# Patient Record
Sex: Male | Born: 1990 | Race: Black or African American | Hispanic: No | Marital: Married | Smoking: Current some day smoker
Health system: Southern US, Community
[De-identification: ages and names within clinical notes are randomized; demographics above are authoritative.]

## PROBLEM LIST (undated history)

## (undated) DIAGNOSIS — J45909 Unspecified asthma, uncomplicated: Secondary | ICD-10-CM

## (undated) DIAGNOSIS — I10 Essential (primary) hypertension: Secondary | ICD-10-CM

## (undated) HISTORY — PX: HAND SURGERY: SHX662

---

## 2017-10-11 ENCOUNTER — Encounter (HOSPITAL_COMMUNITY): Payer: Self-pay | Admitting: *Deleted

## 2017-10-11 ENCOUNTER — Other Ambulatory Visit: Payer: Self-pay

## 2017-10-11 ENCOUNTER — Emergency Department (HOSPITAL_COMMUNITY)
Admission: EM | Admit: 2017-10-11 | Discharge: 2017-10-11 | Disposition: A | Discharge status: Home / Self Care | Payer: Self-pay | Attending: Emergency Medicine | Admitting: Emergency Medicine

## 2017-10-11 DIAGNOSIS — J45909 Unspecified asthma, uncomplicated: Secondary | ICD-10-CM | POA: Insufficient documentation

## 2017-10-11 DIAGNOSIS — K0889 Other specified disorders of teeth and supporting structures: Secondary | ICD-10-CM | POA: Insufficient documentation

## 2017-10-11 DIAGNOSIS — I10 Essential (primary) hypertension: Secondary | ICD-10-CM | POA: Insufficient documentation

## 2017-10-11 DIAGNOSIS — F17200 Nicotine dependence, unspecified, uncomplicated: Secondary | ICD-10-CM | POA: Insufficient documentation

## 2017-10-11 HISTORY — DX: Unspecified asthma, uncomplicated: J45.909

## 2017-10-11 HISTORY — DX: Essential (primary) hypertension: I10

## 2017-10-11 LAB — GROUP A STREP BY PCR: Group A Strep by PCR: NOT DETECTED

## 2017-10-11 MED ORDER — BUPIVACAINE-EPINEPHRINE (PF) 0.5% -1:200000 IJ SOLN
1.8000 mL | Freq: Once | INTRAMUSCULAR | Status: AC
  Administered 2017-10-11: 1.8 mL
  Filled 2017-10-11: qty 1.8

## 2017-10-11 MED ORDER — IBUPROFEN 400 MG PO TABS
600.0000 mg | ORAL_TABLET | Freq: Once | ORAL | Status: AC
  Administered 2017-10-11: 600 mg via ORAL
  Filled 2017-10-11: qty 1

## 2017-10-11 MED ORDER — PENICILLIN V POTASSIUM 500 MG PO TABS: 500.0000 mg | ORAL_TABLET | Freq: Four times a day (QID) | ORAL | 0 refills | Status: AC

## 2017-10-11 MED ORDER — LIDOCAINE VISCOUS HCL 2 % MT SOLN: 15.0000 mL | OROMUCOSAL | 2 refills | Status: DC | PRN

## 2017-10-11 NOTE — Discharge Instructions (Addendum)
Your blood pressure was higher than normal today.  Please follow-up with a primary care provider on this matter.  Dental Pain You have been seen today for dental pain. You should follow up with a dentist as soon as possible. This problem will not resolve on its own without the care of a dentist. Use ibuprofen or naproxen for pain. Use the viscous lidocaine for mouth pain. Swish with the lidocaine and spit it out. Do not swallow it. You should also swish with a homemade salt water solution, twice a day.  Make this solution by mixing 8 ounces of warm water with about half a teaspoon of salt. Antiinflammatory medications: Take 600 mg of ibuprofen every 6 hours or 440 mg (over the counter dose) to 500 mg (prescription dose) of naproxen every 12 hours for the next 3 days. After this time, these medications may be used as needed for pain. Take these medications with food to avoid upset stomach. Choose only one of these medications, do not take them together. Acetaminophen (generic for Tylenol): Should you continue to have additional pain while taking the ibuprofen or naproxen, you may add in acetaminophen as needed. Your daily total maximum amount of acetaminophen from all sources should be limited to 4000mg /day for persons without liver problems, or 2000mg /day for those with liver problems.  Please take all of your antibiotics until finished!   You may develop abdominal discomfort or diarrhea from the antibiotic.  You may help offset this with probiotics which you can buy or get in yogurt. Do not eat or take the probiotics until 2 hours after your antibiotic.   Dental Resource Guide  [employer] [address], [address] [place], [place] [address] [phone]  [facility] [address] [place], [place] [address] [phone]  [facility] [address] [place], [place] [address] [phone]  [facility] [address],  [address] [place], [place] [address] [phone]  [facility] [address] [place], [place] [address] [phone]  [facility] [employer] [email or web address]  [facility] [facility] [address] [place], [place] [address] [phone]  Website for free, low-income, or sliding scale dental services in : www.freedental.us  To find a dentist in [place] and surrounding areas: GuyGalaxy.[email or web address]  [facility] [email or web address]  [facility] Medicaid Dentist [email or web address]

## 2017-10-11 NOTE — ED Provider Notes (Signed)
[facility] EMERGENCY DEPARTMENT Provider Note   CSN: [id number] Arrival date & time: 10/11/17  1214     History   Chief Complaint Chief Complaint  Patient presents with  . Sore Throat  . Dental Pain    HPI Stephen Gregory is a 28 y.o. male.  HPI   Stephen Gregory is a 27 y.o. male, with a history of asthma and HTN, presenting to the ED with left upper dental pain for last two days.  Pain is aching/throbbing, 9/10, radiating posteriorly.  Accompanied by nausea.  Has been taking ibuprofen without complete relief.  Denies fever/chills, vomiting, difficulty swallowing or breathing, or any other complaints.    Past Medical History:  Diagnosis Date  . Asthma   . Hypertension     There are no active problems to display for this patient.   History reviewed. No pertinent surgical history.      Home Medications    Prior to Admission medications   Medication Sig Start Date End Date Taking? Authorizing Provider  lidocaine (XYLOCAINE) 2 % solution Use as directed 15 mLs in the mouth or throat as needed for mouth pain. 10/11/17   Yoshi Vicencio C, PA-C  penicillin v potassium (VEETID) 500 MG tablet Take 1 tablet (500 mg total) by mouth 4 (four) times daily for 7 days. 10/11/17 10/18/17  Anselm Pancoast, PA-C    Family History History reviewed. No pertinent family history.  Social History Social History   Tobacco Use  . Smoking status: Current Some Day Smoker  . Smokeless tobacco: Never Used  Substance Use Topics  . Alcohol use: Not on file  . Drug use: Not on file     Allergies   Codeine   Review of Systems Review of Systems  Constitutional: Negative for chills and fever.  HENT: Positive for dental problem. Negative for trouble swallowing and voice change.   Eyes: Negative for visual disturbance.  Respiratory: Negative for shortness of breath.   Cardiovascular: Negative for chest pain.  Gastrointestinal: Positive for nausea. Negative for vomiting.    Neurological: Negative for dizziness, weakness, light-headedness and numbness.  All other systems reviewed and are negative.    Physical Exam Updated Vital Signs BP (!) 177/98 (BP Location: Right Arm)   Pulse 80   Temp 98 F (36.7 C) (Oral)   Resp 20   SpO2 100%   Physical Exam  Constitutional: He is oriented to person, place, and time. He appears well-developed and well-nourished. No distress.  HENT:  Head: Normocephalic and atraumatic.  Erosion to the medial half of the left maxillary rearmost molar with associated tenderness.  No noted pulp exposure. Dentition appears to be stable.  No noted area of swelling or fluctuance.  No trismus.  Mouth opening to at least 3 finger widths.  Handles oral secretions without difficulty.  No noted facial swelling.  No swelling or tenderness to the submental or submandibular regions.  No swelling or tenderness into the soft tissues of the neck.  Eyes: Conjunctivae are normal.  Neck: Normal range of motion. Neck supple.  Cardiovascular: Normal rate, regular rhythm, normal heart sounds and intact distal pulses.  Pulmonary/Chest: Effort normal and breath sounds normal. No respiratory distress.  Abdominal: He exhibits no distension.  Musculoskeletal: He exhibits no edema.  Lymphadenopathy:    He has no cervical adenopathy.  Neurological: He is alert and oriented to person, place, and time.  Sensation grossly intact to light touch in the extremities. Strength 5/5 in all extremities.  No gait disturbance. Coordination intact. Cranial nerves III-XII grossly intact. No facial droop.   Skin: Skin is warm and dry. He is not diaphoretic. No pallor.  Psychiatric: He has a normal mood and affect. His behavior is normal.  Nursing note and vitals reviewed.    ED Treatments / Results  Labs (all labs ordered are listed, but only abnormal results are displayed) Labs Reviewed  GROUP A STREP BY PCR    EKG None  Radiology No results  found.  Procedures Dental Block Date/Time: 10/11/2017 2:20 PM Performed by: Anselm Pancoast, PA-C Authorized by: Anselm Pancoast, PA-C   Consent:    Consent obtained:  Verbal   Consent given by:  Patient   Risks discussed:  Allergic reaction, infection, swelling, unsuccessful block and pain Indications:    Indications: dental pain   Location:    Block type:  Posterior superior alveolar   Laterality:  Left Procedure details (see MAR for exact dosages):    Syringe type:  Controlled syringe   Needle gauge:  27 G   Anesthetic injected:  Bupivacaine 0.5% WITH epi   Injection procedure:  Anatomic landmarks identified, anatomic landmarks palpated, introduced needle, negative aspiration for blood and incremental injection Post-procedure details:    Outcome:  Anesthesia achieved   Patient tolerance of procedure:  Tolerated well, no immediate complications   (including critical care time)  Medications Ordered in ED Medications  ibuprofen (ADVIL,MOTRIN) tablet 600 mg (has no administration in time range)  bupivacaine-epinephrine (MARCAINE W/ EPI) 0.5% -1:200000 injection 1.8 mL (1.8 mLs Infiltration Given 10/11/17 1421)     Initial Impression / Assessment and Plan / ED Course  I have reviewed the triage vital signs and the nursing notes.  Pertinent labs & imaging results that were available during my care of the patient were reviewed by me and considered in my medical decision making (see chart for details).  Clinical Course as of Oct 11 1525  Tue Oct 11, 2017  1432 Spoke with Dr. Lucky Cowboy, dentist on call.  Agrees with plan for initiating antibiotics and office follow-up.  Requests we fax information on the patient to his office [phone]).  Patient should call the office today to set up an appointment to be seen this week.   [SJ]    Clinical Course User Index [SJ] Joffre Lucks C, PA-C    Patient presents with left upper dental pain.  Low suspicion for sepsis or Ludwig's angioedema.   Dental follow-up. The patient was given instructions for home care as well as return precautions. Patient voices understanding of these instructions, accepts the plan, and is comfortable with discharge.    Patient also noted to be hypertensive.  He endorses an occasional headache, of which he has a history.  No chest pain, shortness of breath, dizziness, syncope, urinary symptoms, or neuro deficits.  Doubt hypertensive emergency.  He is aware of this issue, but I counseled him on the importance of PCP follow-up.  Resources given.    Final Clinical Impressions(s) / ED Diagnoses   Final diagnoses:  Pain, dental    ED Discharge Orders         Ordered    lidocaine (XYLOCAINE) 2 % solution  As needed     10/11/17 1358    penicillin v potassium (VEETID) 500 MG tablet  4 times daily     10/11/17 1358           Anselm Pancoast, PA-C 10/11/17 1548    Messick, Noralyn Pick,  MD 10/13/17 1110

## 2017-10-11 NOTE — ED Triage Notes (Signed)
Pt in c/o left sided mouth pain and swelling, also sore throat and fatigue that started yesterday, taking ibuprofen at home without relief

## 2017-10-11 NOTE — Progress Notes (Signed)
CSW and RNCM spoke with PA to update PA on information needed for pt to be seen by dentist. No further transitions of care needed at this time. CSW signing off.   Claude Manges Chetan Mehring, MSW, LCSW-A Emergency Department Clinical Social Worker [phone]

## 2018-10-30 ENCOUNTER — Encounter (HOSPITAL_COMMUNITY): Payer: Self-pay | Admitting: Family Medicine

## 2018-10-30 ENCOUNTER — Other Ambulatory Visit: Payer: Self-pay

## 2018-10-30 ENCOUNTER — Ambulatory Visit (HOSPITAL_COMMUNITY)
Admission: EM | Admit: 2018-10-30 | Discharge: 2018-10-30 | Disposition: A | Discharge status: Home / Self Care | Payer: Self-pay | Attending: Family Medicine | Admitting: Family Medicine

## 2018-10-30 DIAGNOSIS — S39012A Strain of muscle, fascia and tendon of lower back, initial encounter: Secondary | ICD-10-CM

## 2018-10-30 MED ORDER — DICLOFENAC SODIUM 75 MG PO TBEC: 75.0000 mg | DELAYED_RELEASE_TABLET | Freq: Two times a day (BID) | ORAL | 0 refills | Status: DC

## 2018-10-30 MED ORDER — CYCLOBENZAPRINE HCL 10 MG PO TABS: 10.0000 mg | ORAL_TABLET | Freq: Two times a day (BID) | ORAL | 0 refills | Status: AC | PRN

## 2018-10-30 NOTE — Discharge Instructions (Addendum)
The Flexeril will make you drowsy so just take it at night when she go back to work.

## 2018-10-30 NOTE — ED Provider Notes (Signed)
[facility]    CSN: [id number] Arrival date & time: 10/30/18  0759      History   Chief Complaint Chief Complaint  Patient presents with  . Fall  . Back Pain    HPI Stephen Gregory is a 28 y.o. male.   This is the initial visit for this 28 year old man.  He presents complaining of back pain.  Problem began on Saturday night when he took a misstep and felt a jolt in his left low back.  Pain is continued.  He has had no radiation of the pain.  It remains in his left paralumbar region.  He has not had pain like this before but is able to function.  He has some pain when he lies down flat but it is worse when he stands and twists to the left.  He has no numbness in his legs he is having no difficulty voiding or eliminating.  He has had no fever and no numbness.  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  He likes to workout during wrestling but he missed his workout yesterday trying to recover.     Past Medical History:  Diagnosis Date  . Asthma   . Hypertension     There are no active problems to display for this patient.   Past Surgical History:  Procedure Laterality Date  . HAND SURGERY         Home Medications    Prior to Admission medications   Medication Sig Start Date End Date Taking? Authorizing Provider  cyclobenzaprine (FLEXERIL) 10 MG tablet Take 1 tablet (10 mg total) by mouth 2 (two) times daily as needed for muscle spasms. 10/30/18   Elvina SidleLauenstein, Isaic Syler, MD  diclofenac (VOLTAREN) 75 MG EC tablet Take 1 tablet (75 mg total) by mouth 2 (two) times daily. 10/30/18   Elvina SidleLauenstein, Nautica Hotz, MD    Family History Family History  Family history unknown: Yes    Social History Social History   Tobacco Use  . Smoking status: Current Some Day Smoker  . Smokeless tobacco: Never Used  Substance Use Topics  . Alcohol use: Not on file  . Drug use: Not on file     Allergies   Codeine and Shellfish allergy   Review of Systems  Review of Systems  Musculoskeletal: Positive for back pain.  All other systems reviewed and are negative.    Physical Exam Triage Vital Signs ED Triage Vitals  Enc Vitals Group     BP      Pulse      Resp      Temp      Temp src      SpO2      Weight      Height      Head Circumference      Peak Flow      Pain Score      Pain Loc      Pain Edu?      Excl. in GC?    No data found.  Updated Vital Signs BP (!) 174/81 (BP Location: Right Arm)   Pulse 89   Temp 98.4 F (36.9 C) (Oral)   Resp 18   SpO2 97%   Visual Acuity Right Eye Distance:   Left Eye Distance:   Bilateral Distance:    Right Eye Near:   Left Eye Near:    Bilateral Near:     Physical Exam Vitals signs and nursing note reviewed.  Constitutional:      General: He is not in acute distress.    Appearance: Normal appearance. He is obese.  Eyes:     Conjunctiva/sclera: Conjunctivae normal.  Neck:     Musculoskeletal: Normal range of motion and neck supple.  Cardiovascular:     Rate and Rhythm: Normal rate.  Pulmonary:     Effort: Pulmonary effort is normal.  Musculoskeletal: Normal range of motion.        General: Tenderness and signs of injury present.     Comments: Patient is able to do a sit up without difficulty.  Straight leg raising is negative.  He does have tenderness in the left paralumbar region.  Skin:    General: Skin is warm and dry.  Neurological:     General: No focal deficit present.     Mental Status: He is alert and oriented to person, place, and time.  Psychiatric:        Mood and Affect: Mood normal.        Behavior: Behavior normal.        Thought Content: Thought content normal.      UC Treatments / Results  Labs (all labs ordered are listed, but only abnormal results are displayed) Labs Reviewed - No data to display  EKG   Radiology No results found.  Procedures Procedures (including critical care time)  Medications Ordered in UC Medications - No data  to display  Initial Impression / Assessment and Plan / UC Course  I have reviewed the triage vital signs and the nursing notes.  Pertinent labs & imaging results that were available during my care of the patient were reviewed by me and considered in my medical decision making (see chart for details).    Final Clinical Impressions(s) / UC Diagnoses   Final diagnoses:  Strain of lumbar region, initial encounter     Discharge Instructions     The Flexeril will make you drowsy so just take it at night when she go back to work.    ED Prescriptions    Medication Sig Dispense Auth. Provider   cyclobenzaprine (FLEXERIL) 10 MG tablet Take 1 tablet (10 mg total) by mouth 2 (two) times daily as needed for muscle spasms. 14 tablet Robyn Haber, MD   diclofenac (VOLTAREN) 75 MG EC tablet Take 1 tablet (75 mg total) by mouth 2 (two) times daily. 14 tablet Robyn Haber, MD     I have reviewed the PDMP during this encounter.   Robyn Haber, MD 10/30/18 [phone]

## 2018-10-30 NOTE — ED Triage Notes (Signed)
Pt presents with back pain after a fall on Saturday night.

## 2021-07-26 ENCOUNTER — Emergency Department (HOSPITAL_COMMUNITY)
Admission: EM | Admit: 2021-07-26 | Discharge: 2021-07-26 | Disposition: A | Discharge status: Home / Self Care | Payer: Self-pay | Attending: Emergency Medicine | Admitting: Emergency Medicine

## 2021-07-26 ENCOUNTER — Encounter (HOSPITAL_COMMUNITY): Payer: Self-pay

## 2021-07-26 ENCOUNTER — Emergency Department (HOSPITAL_COMMUNITY): Payer: Self-pay

## 2021-07-26 DIAGNOSIS — Y9301 Activity, walking, marching and hiking: Secondary | ICD-10-CM | POA: Diagnosis not present

## 2021-07-26 DIAGNOSIS — M25562 Pain in left knee: Secondary | ICD-10-CM | POA: Diagnosis present

## 2021-07-26 DIAGNOSIS — S8992XA Unspecified injury of left lower leg, initial encounter: Secondary | ICD-10-CM | POA: Diagnosis not present

## 2021-07-26 DIAGNOSIS — X509XXA Other and unspecified overexertion or strenuous movements or postures, initial encounter: Secondary | ICD-10-CM | POA: Diagnosis not present

## 2021-07-26 MED ORDER — NAPROXEN 500 MG PO TABS: 500.0000 mg | ORAL_TABLET | Freq: Two times a day (BID) | ORAL | 0 refills | Status: AC

## 2021-07-26 MED ORDER — KETOROLAC TROMETHAMINE 30 MG/ML IJ SOLN
30.0000 mg | Freq: Once | INTRAMUSCULAR | Status: AC
  Administered 2021-07-26: 30 mg via INTRAMUSCULAR
  Filled 2021-07-26: qty 1

## 2021-07-26 NOTE — ED Provider Notes (Signed)
[facility]-EMERGENCY DEPT Provider Note   CSN: [id number] Arrival date & time: 07/26/21  0426     History  Chief Complaint  Patient presents with   Knee Pain    Stephen Gregory is a 31 y.o. male.  31 y.o male with a PMH of left knee injury presents to the ED with a chief complaint of left knee pain while at work today.  Patient currently works the night shift for standing when suddenly he was walking and felt a popping sensation to the left knee.  Patient is able to ambulate however describes having a limp.  He has had a prior injury to the left knee however is unsure what this was.  He has not taken any medication for improvement in symptoms.  There was no trauma, no fevers, no other injury reported.  He does not have established orthopedist.  The history is provided by the patient.  Knee Pain Location:  Knee Time since incident:  1 day Associated symptoms: no back pain and no fever        Home Medications Prior to Admission medications   Medication Sig Start Date End Date Taking? Authorizing Provider  naproxen (NAPROSYN) 500 MG tablet Take 1 tablet (500 mg total) by mouth 2 (two) times daily for 7 days. 07/26/21 08/02/21 Yes Hasheem Voland, Leonie Douglas, PA-C  cyclobenzaprine (FLEXERIL) 10 MG tablet Take 1 tablet (10 mg total) by mouth 2 (two) times daily as needed for muscle spasms. 10/30/18   Elvina Sidle, MD      Allergies    Codeine and Shellfish allergy    Review of Systems   Review of Systems  Constitutional:  Negative for fever.  Cardiovascular:  Negative for chest pain.  Musculoskeletal:  Positive for arthralgias. Negative for back pain.    Physical Exam Updated Vital Signs BP (!) 169/89 (BP Location: Left Arm)   Pulse 89   Temp 97.7 F (36.5 C) (Oral)   Resp 20   Ht 6\' 5"  (1.956 m)   SpO2 98%  Physical Exam Vitals and nursing note reviewed.  Constitutional:      Appearance: Normal appearance.  HENT:     Head: Normocephalic and atraumatic.      Mouth/Throat:     Mouth: Mucous membranes are moist.  Cardiovascular:     Rate and Rhythm: Normal rate.  Pulmonary:     Effort: Pulmonary effort is normal.  Abdominal:     General: Abdomen is flat.  Musculoskeletal:     Cervical back: Normal range of motion and neck supple.     Left knee: Swelling present. Tenderness present over the patellar tendon. Normal alignment and normal patellar mobility. Normal pulse.     Comments: Tenderness to palpation along the patellar tendon especially with knee extension.  Pulses are present, patient is intact throughout.  Localized swelling across the entire patella.  Skin:    General: Skin is warm and dry.  Neurological:     Mental Status: He is alert and oriented to person, place, and time.     ED Results / Procedures / Treatments   Labs (all labs ordered are listed, but only abnormal results are displayed) Labs Reviewed - No data to display  EKG None  Radiology DG Knee 2 Views Left  Result Date: 07/26/2021 CLINICAL DATA:  31 year old male with history of left-sided knee pain. EXAM: LEFT KNEE - 1-2 VIEW COMPARISON:  No priors. FINDINGS: No evidence of fracture, dislocation, or joint effusion. No evidence of arthropathy or  other focal bone abnormality. Soft tissues are unremarkable. IMPRESSION: Negative. Electronically Signed   By: Trudie Reed M.D.   On: 07/26/2021 07:49    Procedures Procedures    Medications Ordered in ED Medications  ketorolac (TORADOL) 30 MG/ML injection 30 mg (30 mg Intramuscular Given 07/26/21 0931)    ED Course/ Medical Decision Making/ A&P                           Medical Decision Making Amount and/or Complexity of Data Reviewed Radiology: ordered.  Risk Prescription drug management.   Patient presents to the ED status post left knee injury while walking and feeling a popping sensation.  Significant swelling noted to the area but no palpable effusion.  No erythema noted, no signs of trauma.  There is  no bony tenderness, pain along the suprapatellar area especially with knee extension, has some decreased range of motion due to pain.  X-ray without any acute findings.  Patient for tendon versus ligament injury.  He is ambulatory but with a limp.  I recommended crutches, he is wearing a sleeve that he has at home, also given Toradol while in the ED to help with pain control.  He is agreeable of outpatient follow-up with orthopedics, patient stable for discharge    Portions of this note were generated with Dragon dictation software. Dictation errors may occur despite best attempts at proofreading.   Final Clinical Impression(s) / ED Diagnoses Final diagnoses:  Injury of left knee, initial encounter    Rx / DC Orders ED Discharge Orders          Ordered    naproxen (NAPROSYN) 500 MG tablet  2 times daily        07/26/21 0907              Claude Manges, PA-C 07/26/21 0981    Mancel Bale, MD 07/27/21 1408

## 2021-07-26 NOTE — Discharge Instructions (Addendum)
The x-ray of your knee did not show any acute finding.  I have prescribed a short course of anti-inflammatories in order to help with pain.  Please start this medication tomorrow as you receive an injection of anti-inflammatories while in the ED today.  Follow-up with your orthopedist as needed.  You may use your crutches to get around if this helps with comfort of your knee.

## 2021-07-26 NOTE — ED Triage Notes (Signed)
Patient said he was walking and his left knee popped. Patient able to walk but limping.

## 2022-06-25 ENCOUNTER — Emergency Department (HOSPITAL_COMMUNITY)
Admission: EM | Admit: 2022-06-25 | Discharge: 2022-06-25 | Disposition: A | Discharge status: Home / Self Care | Payer: BC Managed Care – PPO | Attending: Emergency Medicine | Admitting: Emergency Medicine

## 2022-06-25 ENCOUNTER — Emergency Department (HOSPITAL_COMMUNITY): Payer: BC Managed Care – PPO

## 2022-06-25 ENCOUNTER — Other Ambulatory Visit: Payer: Self-pay

## 2022-06-25 ENCOUNTER — Encounter (HOSPITAL_COMMUNITY): Payer: Self-pay | Admitting: Emergency Medicine

## 2022-06-25 DIAGNOSIS — R109 Unspecified abdominal pain: Secondary | ICD-10-CM | POA: Diagnosis not present

## 2022-06-25 DIAGNOSIS — M549 Dorsalgia, unspecified: Secondary | ICD-10-CM | POA: Diagnosis not present

## 2022-06-25 DIAGNOSIS — Y92410 Unspecified street and highway as the place of occurrence of the external cause: Secondary | ICD-10-CM | POA: Insufficient documentation

## 2022-06-25 DIAGNOSIS — M25512 Pain in left shoulder: Secondary | ICD-10-CM | POA: Insufficient documentation

## 2022-06-25 DIAGNOSIS — R079 Chest pain, unspecified: Secondary | ICD-10-CM | POA: Insufficient documentation

## 2022-06-25 DIAGNOSIS — M25561 Pain in right knee: Secondary | ICD-10-CM | POA: Diagnosis not present

## 2022-06-25 DIAGNOSIS — R55 Syncope and collapse: Secondary | ICD-10-CM | POA: Insufficient documentation

## 2022-06-25 LAB — COMPREHENSIVE METABOLIC PANEL
ALT: 32 U/L (ref 0–44)
AST: 24 U/L (ref 15–41)
Albumin: 3.8 g/dL (ref 3.5–5.0)
Alkaline Phosphatase: 63 U/L (ref 38–126)
Anion gap: 9 (ref 5–15)
BUN: 16 mg/dL (ref 6–20)
CO2: 25 mmol/L (ref 22–32)
Calcium: 9 mg/dL (ref 8.9–10.3)
Chloride: 108 mmol/L (ref 98–111)
Creatinine, Ser: 1.31 mg/dL — ABNORMAL HIGH (ref 0.61–1.24)
GFR, Estimated: >60 mL/min (ref >60)
Glucose, Bld: 92 mg/dL (ref 70–99)
Potassium: 3.7 mmol/L (ref 3.5–5.1)
Sodium: 142 mmol/L (ref 135–145)
Total Bilirubin: 0.6 mg/dL (ref 0.3–1.2)
Total Protein: 6.5 g/dL (ref 6.5–8.1)

## 2022-06-25 LAB — I-STAT CHEM 8, ED
BUN: 18 mg/dL (ref 6–20)
Calcium, Ion: 1.18 mmol/L (ref 1.15–1.40)
Chloride: 106 mmol/L (ref 98–111)
Creatinine, Ser: 1.3 mg/dL — ABNORMAL HIGH (ref 0.61–1.24)
Glucose, Bld: 88 mg/dL (ref 70–99)
HCT: 43 % (ref 39.0–52.0)
Hemoglobin: 14.6 g/dL (ref 13.0–17.0)
Potassium: 3.6 mmol/L (ref 3.5–5.1)
Sodium: 144 mmol/L (ref 135–145)
TCO2: 27 mmol/L (ref 22–32)

## 2022-06-25 LAB — CBC
HCT: 42.2 % (ref 39.0–52.0)
Hemoglobin: 14.3 g/dL (ref 13.0–17.0)
MCH: 29.7 pg (ref 26.0–34.0)
MCHC: 33.9 g/dL (ref 30.0–36.0)
MCV: 87.7 fL (ref 80.0–100.0)
Platelets: 252 10*3/uL (ref 150–400)
RBC: 4.81 MIL/uL (ref 4.22–5.81)
RDW: 13 % (ref 11.5–15.5)
WBC: 7 10*3/uL (ref 4.0–10.5)
nRBC: 0 % (ref 0.0–0.2)

## 2022-06-25 MED ORDER — ACETAMINOPHEN 500 MG PO TABS: 1000.0000 mg | ORAL_TABLET | Freq: Four times a day (QID) | ORAL | Status: DC | PRN

## 2022-06-25 MED ORDER — IOHEXOL 350 MG/ML SOLN
75.0000 mL | Freq: Once | INTRAVENOUS | Status: AC | PRN
  Administered 2022-06-25: 75 mL via INTRAVENOUS

## 2022-06-25 MED ORDER — ACETAMINOPHEN 500 MG PO TABS
1000.0000 mg | ORAL_TABLET | Freq: Once | ORAL | Status: AC
  Administered 2022-06-25: 1000 mg via ORAL
  Filled 2022-06-25: qty 2

## 2022-06-25 MED ORDER — FENTANYL CITRATE PF 50 MCG/ML IJ SOSY
50.0000 ug | PREFILLED_SYRINGE | Freq: Once | INTRAMUSCULAR | Status: AC
  Administered 2022-06-25: 50 ug via INTRAVENOUS
  Filled 2022-06-25: qty 1

## 2022-06-25 MED ORDER — METHOCARBAMOL 500 MG PO TABS: 500.0000 mg | ORAL_TABLET | Freq: Two times a day (BID) | ORAL | 0 refills | Status: DC

## 2022-06-25 MED ORDER — METHOCARBAMOL 500 MG PO TABS: 500.0000 mg | ORAL_TABLET | Freq: Two times a day (BID) | ORAL | 0 refills | Status: AC
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
## 2022-06-25 NOTE — Discharge Instructions (Addendum)
Discussed results with patient. Follow up with PCP within 2 for chronic disease changes in lower back seen on CT. No acute findings on imaging.  Return to ED if you develop vision changes, persistent headache, or feel like you're going to pass out.
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
## 2022-07-22 ENCOUNTER — Emergency Department (HOSPITAL_COMMUNITY): Payer: BC Managed Care – PPO

## 2022-07-22 ENCOUNTER — Encounter (HOSPITAL_COMMUNITY): Payer: Self-pay

## 2022-07-22 ENCOUNTER — Emergency Department (HOSPITAL_COMMUNITY)
Admission: EM | Admit: 2022-07-22 | Discharge: 2022-07-22 | Disposition: A | Discharge status: Home / Self Care | Payer: BC Managed Care – PPO | Attending: Emergency Medicine | Admitting: Emergency Medicine

## 2022-07-22 ENCOUNTER — Other Ambulatory Visit: Payer: Self-pay

## 2022-07-22 DIAGNOSIS — X58XXXA Exposure to other specified factors, initial encounter: Secondary | ICD-10-CM | POA: Insufficient documentation

## 2022-07-22 DIAGNOSIS — M5136 Other intervertebral disc degeneration, lumbar region: Secondary | ICD-10-CM | POA: Diagnosis not present

## 2022-07-22 DIAGNOSIS — S39012A Strain of muscle, fascia and tendon of lower back, initial encounter: Secondary | ICD-10-CM | POA: Diagnosis not present

## 2022-07-22 DIAGNOSIS — S29019A Strain of muscle and tendon of unspecified wall of thorax, initial encounter: Secondary | ICD-10-CM | POA: Insufficient documentation

## 2022-07-22 DIAGNOSIS — M479 Spondylosis, unspecified: Secondary | ICD-10-CM

## 2022-07-22 DIAGNOSIS — M25562 Pain in left knee: Secondary | ICD-10-CM | POA: Insufficient documentation

## 2022-07-22 DIAGNOSIS — M25561 Pain in right knee: Secondary | ICD-10-CM | POA: Insufficient documentation

## 2022-07-22 DIAGNOSIS — M545 Low back pain, unspecified: Secondary | ICD-10-CM | POA: Diagnosis present

## 2022-07-22 DIAGNOSIS — S39012D Strain of muscle, fascia and tendon of lower back, subsequent encounter: Secondary | ICD-10-CM

## 2022-07-22 MED ORDER — ACETAMINOPHEN 500 MG PO TABS
1000.0000 mg | ORAL_TABLET | Freq: Once | ORAL | Status: AC
  Administered 2022-07-22: 1000 mg via ORAL
  Filled 2022-07-22: qty 2

## 2022-07-22 MED ORDER — TRAMADOL HCL 50 MG PO TABS: 50.0000 mg | ORAL_TABLET | Freq: Four times a day (QID) | ORAL | 0 refills | Status: AC | PRN

## 2022-07-22 MED ORDER — METHYLPREDNISOLONE 4 MG PO TBPK: ORAL_TABLET | ORAL | 0 refills | Status: DC

## 2022-07-22 NOTE — Discharge Instructions (Addendum)
1.  Take the Medrol Dosepak as prescribed.  You may take extra strength Tylenol every 6 hours with 1-2 tramadol tablets for additional pain control. 2.  Make an appointment to follow-up with [place] [facility].  Contact information is in your discharge instructions.
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
## 2022-12-16 ENCOUNTER — Encounter (HOSPITAL_COMMUNITY): Payer: Self-pay

## 2022-12-16 ENCOUNTER — Emergency Department (HOSPITAL_COMMUNITY): Payer: Self-pay

## 2022-12-16 ENCOUNTER — Emergency Department (HOSPITAL_COMMUNITY)
Admission: EM | Admit: 2022-12-16 | Discharge: 2022-12-17 | Disposition: A | Discharge status: Home / Self Care | Payer: Self-pay | Attending: Emergency Medicine | Admitting: Emergency Medicine

## 2022-12-16 ENCOUNTER — Other Ambulatory Visit: Payer: Self-pay

## 2022-12-16 DIAGNOSIS — J4521 Mild intermittent asthma with (acute) exacerbation: Secondary | ICD-10-CM | POA: Insufficient documentation

## 2022-12-16 DIAGNOSIS — Z79899 Other long term (current) drug therapy: Secondary | ICD-10-CM | POA: Insufficient documentation

## 2022-12-16 DIAGNOSIS — Z1152 Encounter for screening for COVID-19: Secondary | ICD-10-CM | POA: Insufficient documentation

## 2022-12-16 DIAGNOSIS — I10 Essential (primary) hypertension: Secondary | ICD-10-CM | POA: Insufficient documentation

## 2022-12-16 MED ORDER — ALBUTEROL SULFATE HFA 108 (90 BASE) MCG/ACT IN AERS
2.0000 | INHALATION_SPRAY | RESPIRATORY_TRACT | Status: DC | PRN
  Administered 2022-12-16: 2 via RESPIRATORY_TRACT
  Filled 2022-12-16: qty 6.7

## 2022-12-16 NOTE — ED Triage Notes (Signed)
Patient has a history or asthma. Yesterday he started having a cough, sore throat from the cough and chest tightness. Patient has not taken any medication for it because he has not had an issues with his asthma in years.

## 2022-12-17 LAB — SARS CORONAVIRUS 2 BY RT PCR: SARS Coronavirus 2 by RT PCR: NEGATIVE

## 2022-12-17 MED ORDER — PREDNISONE 20 MG PO TABS
60.0000 mg | ORAL_TABLET | Freq: Once | ORAL | Status: AC
  Administered 2022-12-17: 60 mg via ORAL
  Filled 2022-12-17: qty 3

## 2022-12-17 MED ORDER — IPRATROPIUM-ALBUTEROL 0.5-2.5 (3) MG/3ML IN SOLN
3.0000 mL | Freq: Once | RESPIRATORY_TRACT | Status: AC
  Administered 2022-12-17: 3 mL via RESPIRATORY_TRACT
  Filled 2022-12-17: qty 3

## 2022-12-17 MED ORDER — PREDNISONE 10 MG (21) PO TBPK: ORAL_TABLET | Freq: Every day | ORAL | 0 refills | Status: AC

## 2022-12-17 NOTE — Discharge Instructions (Signed)
You were seen here today for your chest tightness and cough.  You are experiencing an asthma exacerbation.  Please take the prescribed steroid for the next week.  Use your inhaler as necessary and follow-up with your primary care doctor.  Additionally please follow-up with your PCP for further evaluation of your blood pressure.  Return to the ER with any severe symptoms

## 2022-12-17 NOTE — ED Provider Notes (Signed)
[place] EMERGENCY DEPARTMENT AT [facility] Provider Note   CSN: [id number] Arrival date & time: 12/16/22  2257     History  Chief Complaint  Patient presents with   Cough    Stephen Gregory is a 32 y.o. male who presents with concern for chest tightness and cough for a few days, hx of asthma in the past, but does not have any medications at this time. Hx of HTN, but managing with life style changes and following with PCP with [facility].   Denies chest pain, palpitations, or other infectious symptoms.   HPI     Home Medications Prior to Admission medications   Medication Sig Start Date End Date Taking? Authorizing Provider  predniSONE (STERAPRED UNI-PAK 21 TAB) 10 MG (21) TBPK tablet Take by mouth daily. Take 6 tabs by mouth daily  for 1 days, then 5 tabs for 1 days, then 4 tabs for 1 days, then 3 tabs for 1 days, 2 tabs for 1 days, then 1 tab by mouth daily for 2 days 12/17/22  Yes Antoinette Haskett R, PA-C  cyclobenzaprine (FLEXERIL) 10 MG tablet Take 1 tablet (10 mg total) by mouth 2 (two) times daily as needed for muscle spasms. 10/30/18   Elvina Sidle, MD  methocarbamol (ROBAXIN) 500 MG tablet Take 1 tablet (500 mg total) by mouth 2 (two) times daily. 06/25/22   Maxwell Marion, PA-C  traMADol (ULTRAM) 50 MG tablet Take 1 tablet (50 mg total) by mouth every 6 (six) hours as needed. 1-2 tablets every 6 hours as needed with extra strength Tylenol 07/22/22   Arby Barrette, MD      Allergies    Codeine and Shellfish allergy    Review of Systems   Review of Systems  Constitutional: Negative.   HENT: Negative.    Respiratory:  Positive for cough, chest tightness and wheezing. Negative for choking, shortness of breath and stridor.   Cardiovascular: Negative.   Gastrointestinal: Negative.     Physical Exam Updated Vital Signs BP (!) 164/98   Pulse 91   Temp 98.2 F (36.8 C) (Oral)   Resp 20   Ht 6\' 5"  (1.956 m)   Wt 117.9 kg   SpO2 96%   BMI 30.83 kg/m   Physical Exam Vitals and nursing note reviewed.  Constitutional:      Appearance: He is not ill-appearing or toxic-appearing.  HENT:     Head: Normocephalic and atraumatic.     Mouth/Throat:     Mouth: Mucous membranes are moist.     Pharynx: Oropharynx is clear. Uvula midline. No oropharyngeal exudate or posterior oropharyngeal erythema.     Tonsils: No tonsillar exudate.  Eyes:     General:        Right eye: No discharge.        Left eye: No discharge.     Conjunctiva/sclera: Conjunctivae normal.  Cardiovascular:     Rate and Rhythm: Normal rate and regular rhythm.     Pulses: Normal pulses.     Heart sounds: Normal heart sounds. No murmur heard. Pulmonary:     Effort: Pulmonary effort is normal. Prolonged expiration present. No tachypnea, accessory muscle usage or respiratory distress.     Breath sounds: Decreased air movement present. Examination of the right-upper field reveals wheezing. Examination of the left-upper field reveals wheezing. Examination of the right-middle field reveals wheezing. Examination of the left-middle field reveals wheezing. Examination of the right-lower field reveals wheezing. Examination of the left-lower field reveals wheezing. Wheezing  present. No rales.  Chest:     Chest wall: No mass, lacerations, deformity, swelling, tenderness, crepitus or edema.  Abdominal:     General: Bowel sounds are normal. There is no distension.     Palpations: Abdomen is soft.     Tenderness: There is no abdominal tenderness.  Musculoskeletal:        General: No deformity.     Cervical back: Neck supple.     Right lower leg: No edema.     Left lower leg: No edema.  Skin:    General: Skin is warm and dry.  Neurological:     Mental Status: He is alert. Mental status is at baseline.  Psychiatric:        Mood and Affect: Mood normal.     ED Results / Procedures / Treatments   Labs (all labs ordered are listed, but only abnormal results are displayed) Labs  Reviewed  SARS CORONAVIRUS 2 BY RT PCR    EKG EKG Interpretation Date/Time:  Friday December 17 2022 00:15:33 EST Ventricular Rate:  94 PR Interval:  130 QRS Duration:  79 QT Interval:  329 QTC Calculation: 412 R Axis:   67  Text Interpretation: Sinus rhythm Anteroseptal infarct, old No significant change was found Confirmed by Glynn Octave [phone]) on 12/17/2022 2:56:02 AM  Radiology DG Chest 2 View  Result Date: 12/17/2022 CLINICAL DATA:  Chest tightness, cough EXAM: CHEST - 2 VIEW COMPARISON:  None Available. FINDINGS: Mild peribronchial thickening. Heart and mediastinal contours are within normal limits. No focal opacities or effusions. No acute bony abnormality. IMPRESSION: Mild bronchitic changes. Electronically Signed   By: Charlett Nose M.D.   On: 12/17/2022 00:16    Procedures Procedures    Medications Ordered in ED Medications  albuterol (VENTOLIN HFA) 108 (90 Base) MCG/ACT inhaler 2 puff (2 puffs Inhalation Given 12/16/22 2313)  ipratropium-albuterol (DUONEB) 0.5-2.5 (3) MG/3ML nebulizer solution 3 mL (3 mLs Nebulization Given 12/17/22 0310)  ipratropium-albuterol (DUONEB) 0.5-2.5 (3) MG/3ML nebulizer solution 3 mL (3 mLs Nebulization Given 12/17/22 0400)  predniSONE (DELTASONE) tablet 60 mg (60 mg Oral Given 12/17/22 0358)    ED Course/ Medical Decision Making/ A&P                                 Medical Decision Making 32 year old male presents with wheezing and chest tightness.  Hypertensive on intake medicines was reassuring.  Cardiac exam unremarkable, pulmonary exam with wheezing throughout the lung fields bilaterally decreased air movement in the bases.  No lower extremity edema.  DDx includes but is not limited to ACS, PE, pleural effusion, pneumonia, pneumothorax, asthma exacerbation.  Amount and/or Complexity of Data Reviewed Labs:     Details: COVID test negative, Radiology: ordered.    Details:  chest x-ray with bronchitic changes,  ECG/medicine  tests:     Details: EKG with sinus rhythm without STEMI.    Risk Prescription drug management.   Patient reevaluated after 2 DuoNebs with significant improvement in his pulmonary exam.  Also offered first dose of prednisone in the ED.  Will discharge with prescription for prednisone taper for acute asthma exacerbation recommend close outpatient follow-up with PCP.  Also discussed patient's hypertension in the emergency department, patient preference to forego antihypertensive initiation at this time and to continue following up with PCP given recent lifestyle changes and weight loss for management of BP.  Clinical concern for emergent underlying etiology as patient  symptoms that warrant further ED workup or inpatient management is exceedingly low.  Claro  voiced understanding of his medical evaluation and treatment plan. Each of their questions answered to their expressed satisfaction.  Return precautions were given.  Patient is well-appearing, stable, and was discharged in good condition.  This chart was dictated using voice recognition software, Dragon. Despite the best efforts of this provider to proofread and correct errors, errors may still occur which can change documentation meaning.         Final Clinical Impression(s) / ED Diagnoses Final diagnoses:  Mild intermittent asthma with exacerbation    Rx / DC Orders ED Discharge Orders          Ordered    predniSONE (STERAPRED UNI-PAK 21 TAB) 10 MG (21) TBPK tablet  Daily        12/17/22 0407              Baeleigh Devincent, Eugene Gavia, PA-C 12/17/22 0429    Glynn Octave, MD 12/17/22 [phone]

## 2022-12-17 NOTE — ED Notes (Addendum)
Pt reported to lobby NT that his CP had worsened. EKG repeated. VS repeated and stable.

## 2022-12-17 NOTE — ED Notes (Signed)
Pt c/o increasing chest pain, states pain is "6/10" in "center of chest". Rechecked VS. Notified triage RN.

## 2023-07-17 IMAGING — CR DG KNEE 1-2V*L*
2 series · 2 of 2 positions shown · non-contrast
Comparison: No priors.

CLINICAL DATA: 30-year-old male with history of left-sided knee
pain.

EXAM:
LEFT KNEE - 1-2 VIEW

[t knee ap left]
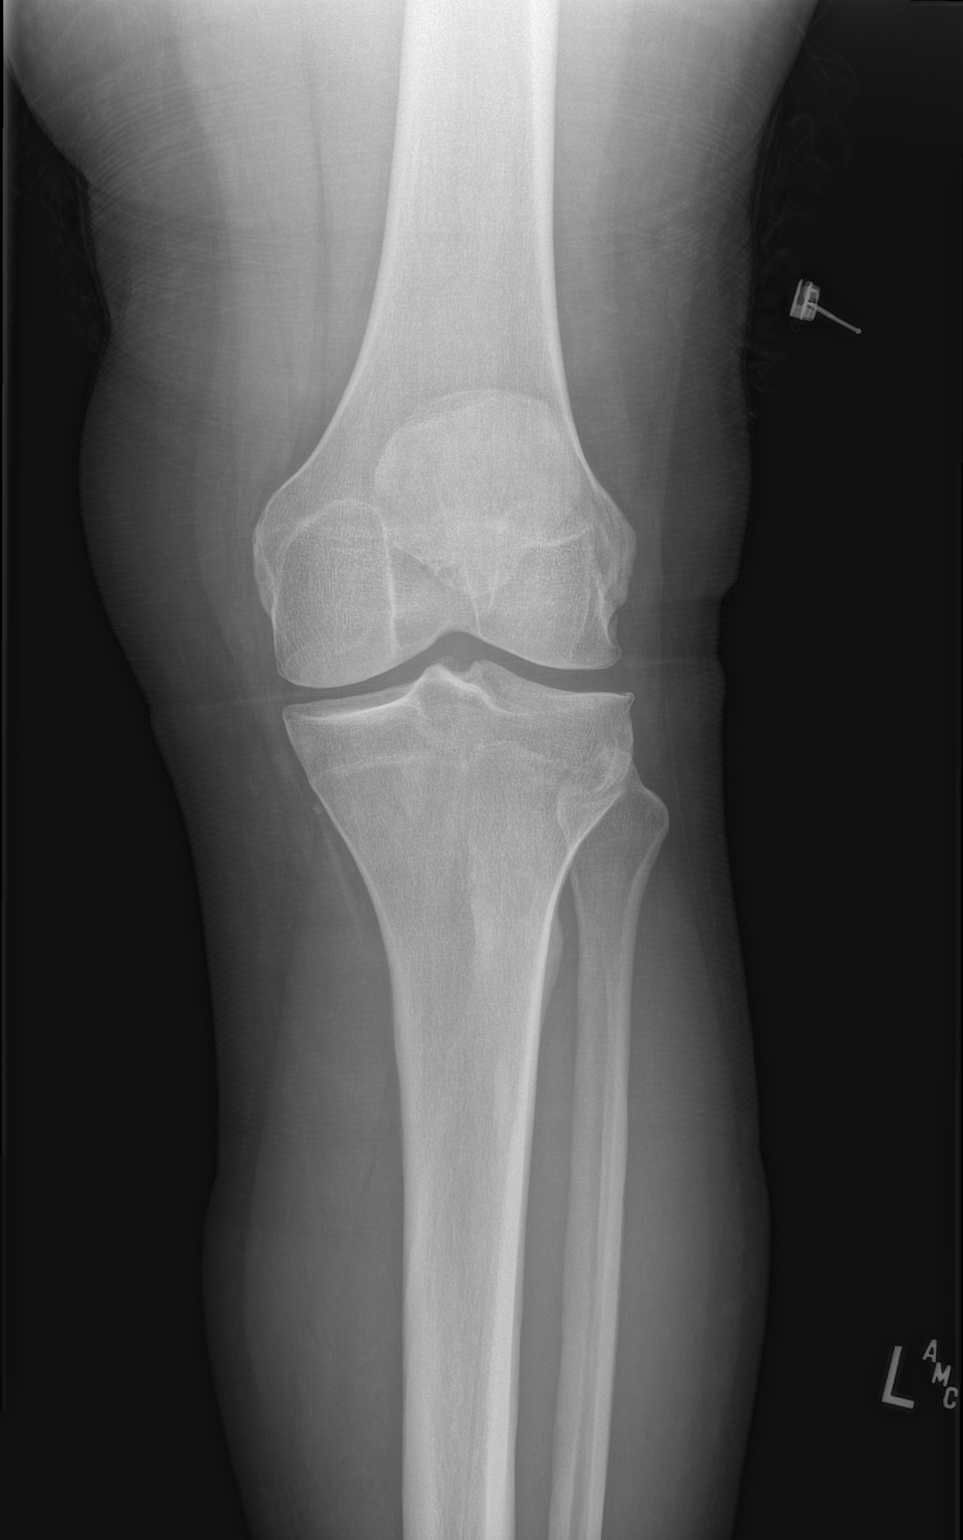

[t knee lat left]
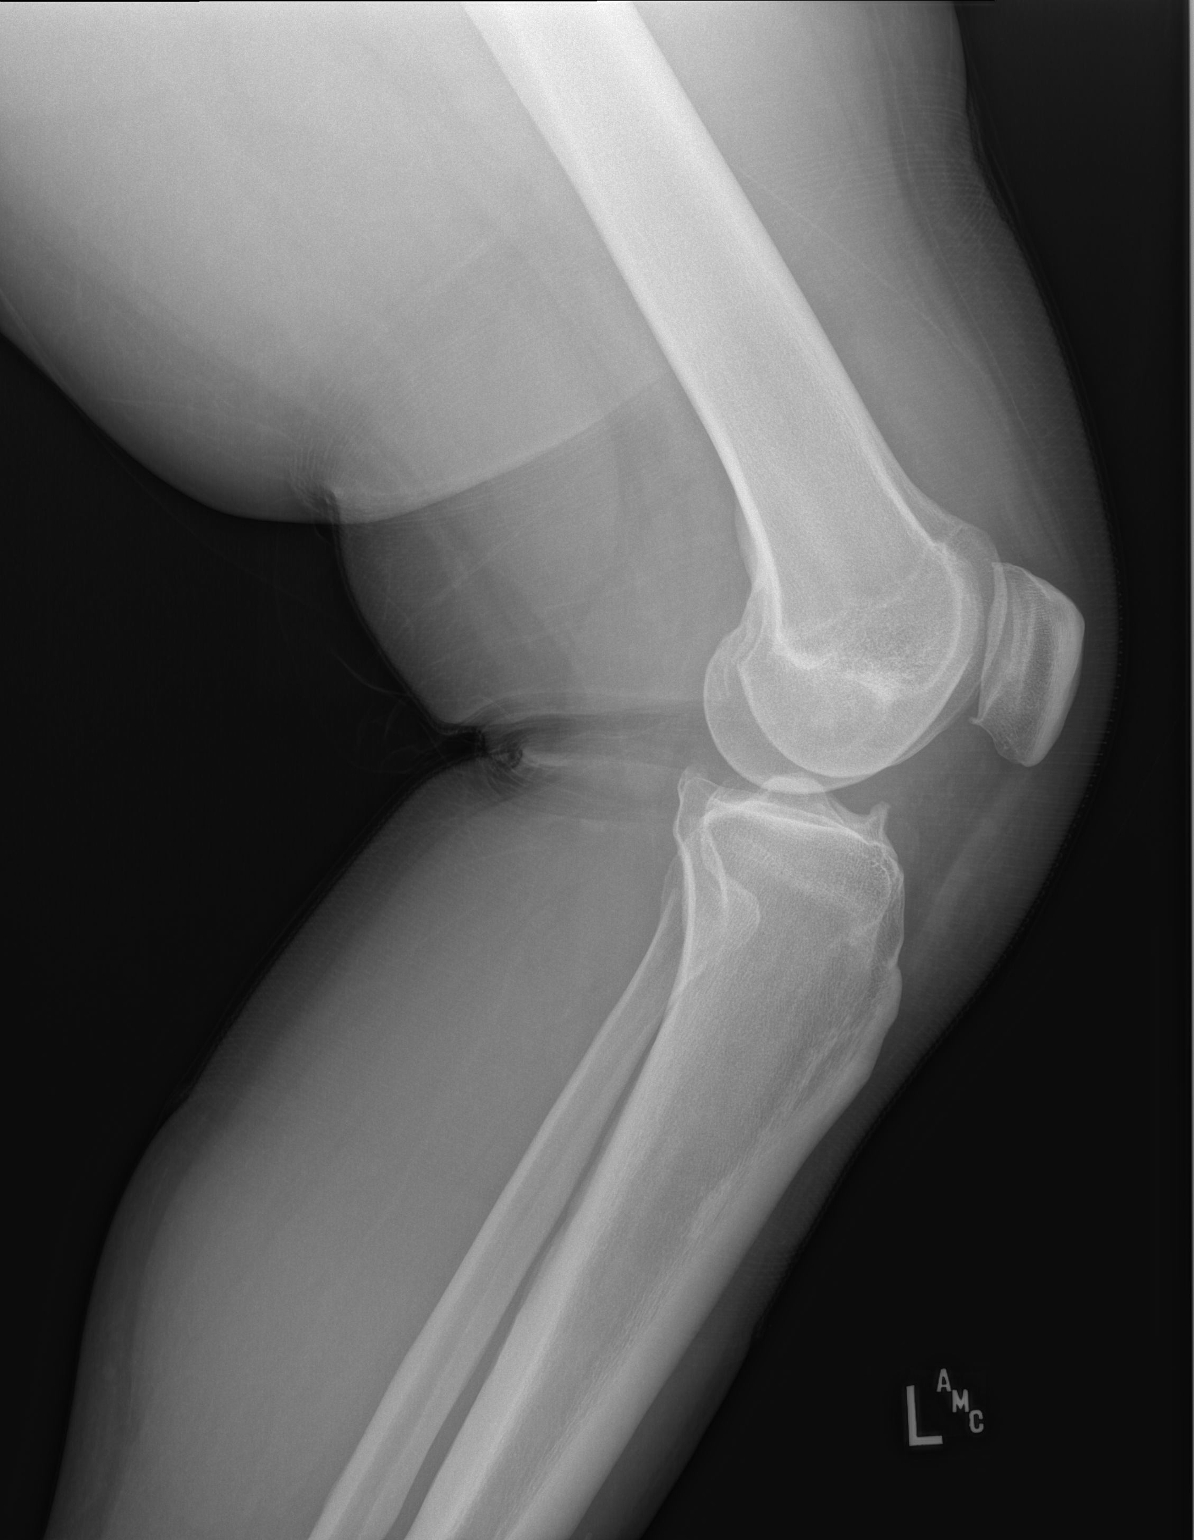

[2 of 2 positions shown; findings below may reference images not displayed]

FINDINGS: No evidence of fracture, dislocation, or joint effusion. No evidence
of arthropathy or other focal bone abnormality. Soft tissues are
unremarkable.
IMPRESSION: Negative.
